# Patient Record
Sex: Male | Born: 2005 | Race: White | Hispanic: No | Marital: Single
Health system: Southern US, Community
[De-identification: ages and names within clinical notes are randomized; demographics above are authoritative.]

---
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
## 2023-01-04 ENCOUNTER — Ambulatory Visit: Payer: Federal, State, Local not specified - PPO | Attending: Internal Medicine | Admitting: Internal Medicine

## 2023-01-04 ENCOUNTER — Encounter: Payer: Self-pay | Admitting: Internal Medicine

## 2023-01-04 VITALS — BP 126/78 | HR 63 | Ht 68.0 in | Wt 182.0 lb

## 2023-01-04 DIAGNOSIS — E790 Hyperuricemia without signs of inflammatory arthritis and tophaceous disease: Secondary | ICD-10-CM | POA: Diagnosis not present

## 2023-01-04 DIAGNOSIS — I10 Essential (primary) hypertension: Secondary | ICD-10-CM

## 2023-01-04 DIAGNOSIS — E559 Vitamin D deficiency, unspecified: Secondary | ICD-10-CM

## 2023-01-04 NOTE — Progress Notes (Signed)
OFFICE NOTE  Chief Complaint:  Established cardiologist  Primary Care Physician: No primary care provider on file.  HPI:  William Schmitt is a 17 y.o. male with a past medial history significant for hypertension.  He is here today with his twin sister and has a twin brother.  They are triplets and all have issues with hypertension.  They have been previously followed at [facility] with pediatric nephrology but have aged out of that.  William Schmitt had a workup for secondary causes of hypertension including renin and aldosterone ratios, and echocardiogram and other testing.  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  He also is currently on amlodipine.  He previously was on lisinopril but had issues with cough and was taken off of that.  Blood pressure initially was 148/72 however recheck came down to 126/78.  He is currently a Consulting civil engineer at [employer]  He is considering going into crime scene investigation.  PMHx:  History reviewed. No pertinent past medical history.  History reviewed. No pertinent surgical history.  FAMHx:  Family History  Problem Relation Age of Onset   Hypertension Father    Hypertension Sister    Hypertension Brother     SOCHx:   has no history on file for tobacco use, alcohol use, and drug use.  ALLERGIES:  Allergies  Allergen Reactions   Lisinopril Cough    ROS: Pertinent items noted in HPI and remainder of comprehensive ROS otherwise negative.  HOME MEDS: Current Outpatient Medications on File Prior to Visit  Medication Sig Dispense Refill   allopurinol (ZYLOPRIM) 100 MG tablet Take 100 mg by mouth.     amLODipine (NORVASC) 10 MG tablet Take 10 mg by mouth.     Cholecalciferol 75 MCG (3000 UT) TABS Take 1 tablet by mouth daily.     No current facility-administered medications on file prior to visit.    LABS/IMAGING: No results found for this or any previous visit (from the past 48 hour(s)). No  results found.  LIPID PANEL: No results found for: "CHOL", "TRIG", "HDL", "CHOLHDL", "VLDL", "LDLCALC", "LDLDIRECT"   WEIGHTS: Wt Readings from Last 3 Encounters:  01/04/23 182 lb (82.6 kg) (85 %, Z= 1.02)*   * Growth percentiles are based on CDC (Boys, 2-20 Years) data.    VITALS: BP 126/78   Pulse 63   Ht  (1.727 m)   Wt 182 lb (82.6 kg)   SpO2 99%   BMI 27.67 kg/m   EXAM: General appearance: alert and no distress Neck: no carotid bruit, no JVD, and thyroid not enlarged, symmetric, no tenderness/mass/nodules Lungs: clear to auscultation bilaterally Heart: regular rate and rhythm, S1, S2 normal, no murmur, click, rub or gallop Abdomen: soft, non-tender; bowel sounds normal; no masses,  no organomegaly Extremities: extremities normal, atraumatic, no cyanosis or edema Pulses: 2+ and symmetric Skin: Skin color, texture, turgor normal. No rashes or lesions Neurologic: Grossly normal Psych: Pleasant  EKG: Normal sinus rhythm with sinus arrhythmia at 63- personally reviewed  ASSESSMENT: Essential hypertension Hyperuricemia Vitamin D deficiency  PLAN: 1.   Mr. William Schmitt has essential hypertension and is previously managed by pediatric nephrology at [facility].  His blood pressure is well-controlled today on amlodipine and previously had been on lisinopril but had cough as a side effect.  Will plan to continue his current therapy.  I encouraged him to establish with a primary care provider who might continue to prescribe his allopurinol.  He  has not had prior gout but was noted to have hyperuricemia.  Follow-up with me in 6 months or sooner as necessary.  Chrystie Nose, MD, [facility], FACP  [place]  [facility]  Medical Director of the Advanced Lipid Disorders &  Cardiovascular Risk Reduction Clinic Diplomate of the American Board of Clinical Lipidology Attending Cardiologist  Direct Dial: [phone]  Fax: [phone]  Website:  [email or web address] 01/04/2023, 4:53 PM

## 2023-01-04 NOTE — Patient Instructions (Signed)
Medication Instructions:  Your physician recommends that you continue on your current medications as directed. Please refer to the Current Medication list given to you today.  *If you need a refill on your cardiac medications before your next appointment, please call your pharmacy*  Follow-Up: At [facility], you and your health needs are our priority.  As part of our continuing mission to provide you with exceptional heart care, we have created designated Provider Care Teams.  These Care Teams include your primary Cardiologist (physician) and Advanced Practice Providers (APPs -  Physician Assistants and Nurse Practitioners) who all work together to provide you with the care you need, when you need it.  We recommend signing up for the patient portal called "MyChart".  Sign up information is provided on this After Visit Summary.  MyChart is used to connect with patients for Virtual Visits (Telemedicine).  Patients are able to view lab/test results, encounter notes, upcoming appointments, etc.  Non-urgent messages can be sent to your provider as well.   To learn more about what you can do with MyChart, go to [email or web address].    Your next appointment:   6 month(s)  Provider:   Dr. Hilty  

## 2023-02-03 DIAGNOSIS — D72829 Elevated white blood cell count, unspecified: Secondary | ICD-10-CM | POA: Diagnosis not present

## 2023-08-25 DIAGNOSIS — Z0000 Encounter for general adult medical examination without abnormal findings: Secondary | ICD-10-CM | POA: Diagnosis not present

## 2023-11-01 DIAGNOSIS — R6883 Chills (without fever): Secondary | ICD-10-CM | POA: Diagnosis not present

## 2023-11-01 DIAGNOSIS — R5383 Other fatigue: Secondary | ICD-10-CM | POA: Diagnosis not present

## 2024-02-14 ENCOUNTER — Ambulatory Visit: Admitting: Family Medicine

## 2024-03-19 DIAGNOSIS — Z0000 Encounter for general adult medical examination without abnormal findings: Secondary | ICD-10-CM | POA: Diagnosis not present

## 2024-04-05 ENCOUNTER — Ambulatory Visit: Admitting: Family Medicine

## 2024-04-19 DIAGNOSIS — F411 Generalized anxiety disorder: Secondary | ICD-10-CM | POA: Diagnosis not present

## 2024-04-19 DIAGNOSIS — I10 Essential (primary) hypertension: Secondary | ICD-10-CM | POA: Diagnosis not present

## 2024-04-19 DIAGNOSIS — M109 Gout, unspecified: Secondary | ICD-10-CM | POA: Diagnosis not present

## 2024-04-24 DIAGNOSIS — N4829 Other inflammatory disorders of penis: Secondary | ICD-10-CM | POA: Diagnosis not present
